# Patient Record
Sex: Female | Born: 1965 | Race: White | Hispanic: No | Marital: Married | Smoking: Never smoker
Health system: Southern US, Community
[De-identification: ages and names within clinical notes are randomized; demographics above are authoritative.]

## PROBLEM LIST (undated history)

## (undated) DIAGNOSIS — E785 Hyperlipidemia, unspecified: Secondary | ICD-10-CM

## (undated) DIAGNOSIS — R921 Mammographic calcification found on diagnostic imaging of breast: Secondary | ICD-10-CM

## (undated) DIAGNOSIS — D069 Carcinoma in situ of cervix, unspecified: Secondary | ICD-10-CM

## (undated) DIAGNOSIS — N6310 Unspecified lump in the right breast, unspecified quadrant: Secondary | ICD-10-CM

## (undated) HISTORY — DX: Hyperlipidemia, unspecified: E78.5

## (undated) HISTORY — DX: Unspecified lump in the right breast, unspecified quadrant: N63.10

## (undated) HISTORY — DX: Mammographic calcification found on diagnostic imaging of breast: R92.1

## (undated) HISTORY — DX: Carcinoma in situ of cervix, unspecified: D06.9

---
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
## 1999-03-01 ENCOUNTER — Other Ambulatory Visit: Admission: RE | Admit: 1999-03-01 | Discharge: 1999-03-01 | Payer: Self-pay | Admitting: Obstetrics and Gynecology

## 1999-03-18 ENCOUNTER — Other Ambulatory Visit: Admission: RE | Admit: 1999-03-18 | Discharge: 1999-03-18 | Payer: Self-pay | Admitting: Obstetrics and Gynecology

## 1999-11-21 ENCOUNTER — Other Ambulatory Visit: Admission: RE | Admit: 1999-11-21 | Discharge: 1999-11-21 | Payer: Self-pay | Admitting: Obstetrics and Gynecology

## 2001-11-17 DIAGNOSIS — C539 Malignant neoplasm of cervix uteri, unspecified: Secondary | ICD-10-CM

## 2001-11-17 HISTORY — DX: Malignant neoplasm of cervix uteri, unspecified: C53.9

## 2001-12-29 ENCOUNTER — Other Ambulatory Visit: Admission: RE | Admit: 2001-12-29 | Discharge: 2001-12-29 | Payer: Self-pay | Admitting: Obstetrics and Gynecology

## 2002-04-08 ENCOUNTER — Ambulatory Visit (HOSPITAL_COMMUNITY): Admission: RE | Admit: 2002-04-08 | Discharge: 2002-04-08 | Payer: Self-pay | Admitting: Obstetrics and Gynecology

## 2002-04-08 ENCOUNTER — Encounter (INDEPENDENT_AMBULATORY_CARE_PROVIDER_SITE_OTHER): Payer: Self-pay

## 2002-05-11 ENCOUNTER — Ambulatory Visit: Admission: RE | Admit: 2002-05-11 | Discharge: 2002-05-11 | Payer: Self-pay | Admitting: Gynecology

## 2002-05-17 HISTORY — PX: ABDOMINAL HYSTERECTOMY: SHX81

## 2002-05-19 ENCOUNTER — Encounter: Payer: Self-pay | Admitting: Gynecology

## 2002-05-24 ENCOUNTER — Encounter (INDEPENDENT_AMBULATORY_CARE_PROVIDER_SITE_OTHER): Payer: Self-pay | Admitting: Specialist

## 2002-05-24 ENCOUNTER — Inpatient Hospital Stay (HOSPITAL_COMMUNITY): Admission: RE | Admit: 2002-05-24 | Discharge: 2002-05-26 | Payer: Self-pay | Admitting: Obstetrics and Gynecology

## 2002-07-12 ENCOUNTER — Ambulatory Visit: Admission: RE | Admit: 2002-07-12 | Discharge: 2002-07-12 | Payer: Self-pay | Admitting: Gynecology

## 2002-09-14 ENCOUNTER — Other Ambulatory Visit: Admission: RE | Admit: 2002-09-14 | Discharge: 2002-09-14 | Payer: Self-pay | Admitting: Obstetrics and Gynecology

## 2002-12-21 ENCOUNTER — Ambulatory Visit: Admission: RE | Admit: 2002-12-21 | Discharge: 2002-12-21 | Payer: Self-pay | Admitting: Gynecologic Oncology

## 2003-03-21 ENCOUNTER — Other Ambulatory Visit: Admission: RE | Admit: 2003-03-21 | Discharge: 2003-03-21 | Payer: Self-pay | Admitting: Gynecologic Oncology

## 2003-03-21 ENCOUNTER — Ambulatory Visit: Admission: RE | Admit: 2003-03-21 | Discharge: 2003-03-21 | Payer: Self-pay | Admitting: Gynecologic Oncology

## 2003-03-21 ENCOUNTER — Encounter (INDEPENDENT_AMBULATORY_CARE_PROVIDER_SITE_OTHER): Payer: Self-pay | Admitting: *Deleted

## 2003-06-06 ENCOUNTER — Ambulatory Visit: Admission: RE | Admit: 2003-06-06 | Discharge: 2003-06-06 | Payer: Self-pay | Admitting: Gynecologic Oncology

## 2003-06-06 ENCOUNTER — Encounter (INDEPENDENT_AMBULATORY_CARE_PROVIDER_SITE_OTHER): Payer: Self-pay | Admitting: *Deleted

## 2003-06-06 ENCOUNTER — Other Ambulatory Visit: Admission: RE | Admit: 2003-06-06 | Discharge: 2003-06-06 | Payer: Self-pay | Admitting: Gynecologic Oncology

## 2003-11-30 ENCOUNTER — Other Ambulatory Visit: Admission: RE | Admit: 2003-11-30 | Discharge: 2003-11-30 | Payer: Self-pay | Admitting: Obstetrics and Gynecology

## 2004-05-31 ENCOUNTER — Other Ambulatory Visit: Admission: RE | Admit: 2004-05-31 | Discharge: 2004-05-31 | Payer: Self-pay | Admitting: Obstetrics and Gynecology

## 2005-01-29 ENCOUNTER — Other Ambulatory Visit: Admission: RE | Admit: 2005-01-29 | Discharge: 2005-01-29 | Payer: Self-pay | Admitting: Obstetrics and Gynecology

## 2005-02-13 ENCOUNTER — Ambulatory Visit (HOSPITAL_COMMUNITY): Admission: RE | Admit: 2005-02-13 | Discharge: 2005-02-13 | Payer: Self-pay | Admitting: Obstetrics and Gynecology

## 2005-08-20 ENCOUNTER — Other Ambulatory Visit: Admission: RE | Admit: 2005-08-20 | Discharge: 2005-08-20 | Payer: Self-pay | Admitting: Obstetrics and Gynecology

## 2006-01-27 ENCOUNTER — Other Ambulatory Visit: Admission: RE | Admit: 2006-01-27 | Discharge: 2006-01-27 | Payer: Self-pay | Admitting: Obstetrics and Gynecology

## 2006-02-16 ENCOUNTER — Ambulatory Visit (HOSPITAL_COMMUNITY): Admission: RE | Admit: 2006-02-16 | Discharge: 2006-02-16 | Payer: Self-pay | Admitting: Obstetrics and Gynecology

## 2007-03-11 ENCOUNTER — Ambulatory Visit (HOSPITAL_COMMUNITY): Admission: RE | Admit: 2007-03-11 | Discharge: 2007-03-11 | Payer: Self-pay | Admitting: Obstetrics and Gynecology

## 2008-03-21 ENCOUNTER — Ambulatory Visit (HOSPITAL_COMMUNITY): Admission: RE | Admit: 2008-03-21 | Discharge: 2008-03-21 | Payer: Self-pay | Admitting: Obstetrics and Gynecology

## 2008-03-30 ENCOUNTER — Encounter: Admission: RE | Admit: 2008-03-30 | Discharge: 2008-03-30 | Payer: Self-pay | Admitting: Obstetrics and Gynecology

## 2009-04-05 ENCOUNTER — Ambulatory Visit (HOSPITAL_COMMUNITY): Admission: RE | Admit: 2009-04-05 | Discharge: 2009-04-05 | Payer: Self-pay | Admitting: Obstetrics and Gynecology

## 2011-04-04 NOTE — Consult Note (Signed)
[facility]  Patient:    Rebekah Holmes, Rebekah Holmes Visit Number: [id number] MRN: [id number]          Service Type: GON Location: GYN Attending Physician:  Jeannette Corpus Dictated by:   Rande Brunt. Clarke-Pearson, M.D. Proc. Date: 05/11/02 Admit Date:  05/11/2002 Discharge Date: 05/11/2002   CC:         Silverio Lay, M.D.  Telford Nab, R.N.   Consultation Report  REASON FOR CONSULTATION:  A 45 year old white single female referred for consultation by Silverio Lay, M.D., regarding management of a newly-diagnosed cervical carcinoma.  The patients past history dates back to March 2000, when she had a high-grade SIL lesion.  In May 2000, she underwent a LEEP procedure revealing CIN-III with negative margins.  She subsequently had normal Pap smears in August 2000, January, May, and December _____.  In January 2003 she had a low-grade SIL lesion, which on further workup revealed a biopsy of the exocervix showing CIN-III lesions.  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  The lesion is at minimum 4 x 9 mm.  The patient has had an uncomplicated postoperative course.  PAST MEDICAL HISTORY:  Medical illnesses:  None.  Obstetrical history: Gravida 1.  Surgical history:  The patient had a traumatic fracture of her liver as a child requiring laparotomy.  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  MEDICATIONS:  Minocin.  DRUG ALLERGIES:  None.  FAMILY HISTORY:  The patient has a maternal grandmother with pancreatic cancer and a maternal aunt with ovarian cancer.  SOCIAL HISTORY:  The patient is divorced.  She has a 70 year old child.  She works as a Public librarian.  REVIEW OF SYSTEMS:  Essentially negative except for some vaginal bleeding. She  denies any GI or GU symptoms, has no cardiovascular, pulmonary, or neurologic symptoms.  PHYSICAL EXAMINATION:  VITAL SIGNS:  Weight 111 pounds, blood pressure 160/100.  GENERAL:  The patient is a healthy young woman in no acute distress.  HEENT:  Negative.  NECK:  Supple without thyromegaly.  LYMPHATIC:  There is no supraclavicular or inguinal adenopathy.  ABDOMEN:  Soft, nontender.  No mass, organomegaly, ascites, or hernias are noted.  PELVIC:  EG, BUS normal.  The vagina has some blood in it.  No lesions are noted.  The cervix is flush with the upper vagina, and there is some bleeding coming from the cervix from what looks like the cone bed.  Bimanual and rectovaginal exam reveal a small cervix and uterus which is anterior.  There is no parametrial involvement and no adnexal masses.  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  I had a lengthy discussion with the patient and her mother regarding management options.  We talked in detail about the pros and cons of both radiation therapy and surgery.  After discussing this and emphasizing that both have similar cure rates, the patient elects to proceed with surgery.  The risks of surgery, including hemorrhage, infection, injury to adjacent viscera, and thromboembolic complications, were all outlined.  I especially emphasized the fact that a good number of patients will have bladder dysfunction regarding radical hysterectomy and that she will need to use a suprapubic catheter initially.  The possibility of intermittent self-catheterization was also discussed.  We would anticipate leaving her ovaries in place for further  hormone function.  All the patients questions are answered, and she agreed to proceed with surgery.  We will coordinate surgery with Dr. Estanislado Pandy and anticipate proceeding on May 24, 2002. Dictated by:   Rande Brunt. Clarke-Pearson, M.D. Attending Physician:  Jeannette Corpus DD:   05/11/02 TD:  05/13/02 Job: [id number]

## 2011-04-04 NOTE — Consult Note (Signed)
NAME:  Rebekah Holmes, Rebekah Holmes                       ACCOUNT NO.:  [id number]   MEDICAL RECORD NO.:  [id number]                   PATIENT TYPE:  OUT   LOCATION:  GYN                                  FACILITY:  [facility]   PHYSICIAN:  John T. Kyla Balzarine, M.D.                 DATE OF BIRTH:  05/19/1966   DATE OF CONSULTATION:  03/21/2003  DATE OF DISCHARGE:                                   CONSULTATION   GYNECOLOGY CONSULTATION:   CHIEF COMPLAINT:  Ongoing followup of vaginal dysplasia after radical  hysterectomy.   HISTORY OF PRESENT ILLNESS:  The patient had cone biopsy revealing stage IB-  1 squamous carcinoma of the cervix in July 2003.  Radical hysterectomy  specimen had no residual tumor, margins and nodes negative.  Pap smear in  October 2003 significant for possible low-grade SIL.  Dr. Estanislado Pandy performed  colposcopy with no specific changes suggesting dysplasia.  Biopsy returned  slight dysplasia.  I saw the patient 3 months ago and recommended weekly  vaginal Premarin which she has been using.   MEDICATIONS:  Minocin for acne rosacea.   ALLERGIES:  None known.   PAST MEDICAL HISTORY, PERSONAL AND SOCIAL HISTORY, FAMILY HISTORY AND REVIEW  OF SYSTEMS:  Reviewed and essentially unchanged from her intake evaluation  in June 2003, noncontributory.   PHYSICAL EXAMINATION:  VITAL SIGNS:  Weight 113 pounds, blood pressure  132/76, otherwise stable and afebrile.  GENERAL:  The patient is anxious, alert and oriented x3 in no acute  distress.  ENT:  Benign with clear oropharynx.  BACK:  No back or CVA tenderness.  ABDOMEN:  Soft and benign, well-healed transverse incision without hernia,  ascites, mass, or tenderness.  LYMPHATICS:  Negative.  EXTREMITIES:  No edema.  PELVIC:  External genitalia and BUS are normal to inspection and palpation.  The bladder and urethra are well supported and there are no gross vaginal  lesions.  Bimanual and rectovaginal examinations reveal minimal  postoperative induration but no submucosal mass, adnexal or parametrial mass  or nodularity.   PROCEDURE NOTE:  After verbal informed consent was obtained, I performed  colposcopy of the entire vaginal canal.  No acetowhite epithelium is seen.  There remained slightly atrophic vaginal mucosa over the apical scar with an  atrophic vascular pad, but again no acetowhite change.  No biopsy is  obtained.   ASSESSMENT:  Low-grade vaginal dysplasia following radical hysterectomy for  stage IB-1 squamous carcinoma of the cervix.    PLAN:  The patient is reassured regarding current findings and will return  for followup in 3-4 months.  The Pap smear results from today will be  communicated to the patient.  John T. Kyla Balzarine, M.D.    JTS/MEDQ  D:  03/21/2003  T:  03/21/2003  Job:  [id number]   cc:   Dois Davenport A. Rivard, M.D.  [address]  [place]  [place] [address]  Fax: [phone]   Telford Nab, R.N.

## 2011-04-04 NOTE — Consult Note (Signed)
NAME:  Rebekah Holmes, Rebekah Holmes                       ACCOUNT NO.:  [id number]   MEDICAL RECORD NO.:  [id number]                   PATIENT TYPE:  OUT   LOCATION:  GYN                                  FACILITY:  [facility]   PHYSICIAN:  John T. Kyla Balzarine, M.D.                 DATE OF BIRTH:  01-21-1966   DATE OF CONSULTATION:  12/21/2002  DATE OF DISCHARGE:                                   CONSULTATION   CHIEF COMPLAINT:  This 45 year old woman presents for evaluation of vaginal  dysplasia following radical hysterectomy for cervical cancer.   HISTORY OF PRESENT ILLNESS:  The patient had a cone biopsy revealing stage  1B-1 squamous carcinoma of the cervix on May 24, 2002. She had no residual  tumor in the uterus, margins and nodes were negative. The patient was  followed with initial normal Pap smear but Pap smear in October 2003 was  significant for possible low grade SIL. She was seen by Dr. Dois Davenport Rivard  who performed colposcopy, revealing only some vascularity overlying the  surgical incision, thought to represent fibrosis. Biopsy was obtained and  returned slight dysplasia.  The patient  is extremely concerned that this  might represent recurrent cervical cancer. She denies pelvic pain or vaginal  bleeding. She denies change in bowel or bladder function other than urinary  hesitance since her surgery. She denies back pain or leg swelling.   CURRENT MEDICATIONS:  Minocin for acne rosacea.   ALLERGIES:  None known.   Past medical history, personal social history, family history are reviewed  and unchanged from her intake evaluation in June 2003.   REVIEW OF SYMPTOMS:  Otherwise negative.   PHYSICAL EXAMINATION:  VITAL SIGNS:  Weight 114 pounds, blood pressure  120/76, otherwise stable and afebrile.  GENERAL:  The patient is anxious, alert, and oriented x3 in no acute  distress.  ENT:  Benign with clear oropharynx.  BACK:  There is no back or CVA tenderness.  ABDOMEN:  Soft and benign  with a well healed transverse incision, without  hernia, ascites, mass or tenderness.  EXTREMITIES:  Have no edema and there is no pathologic lymphadenopathy.  PELVIC:  External genitalia and BUS are normal to inspection and palpation.  The bladder and urethra are well supported and there are not gross vaginal  mucosal lesions. Bimanual and rectovaginal examinations reveal minimal  postoperative induration in the left fornix but no submucosal mass, adnexal  or parametrial mass or nodularity. Rectal is confirmatory.   PROCEDURE NOTE:  After verbal informed consent was obtained, I performed  colposcopy of the entire vaginal canal. No acetowhite epithelium is seen.  There is slightly atrophic vaginal mucosa over the apical scar, with  atrophic vascular pattern, but again no acetowhite change. Healing biopsy  site is visualized. No biopsy is obtained.   ASSESSMENT:  Low grade vaginal dysplasia following a radical hysterectomy  for stage 1B-1 squamous carcinoma  of the cervix. Currently no evidence of  recurrent cervical cancer and minimal findings on colposcopy.   FINDINGS AND RECOMMENDATIONS:  I had a discussion with the patient regarding  the possibility that her cytology and biopsy might reflect atrophy overlying  the surgical vaginal scar rather than a true dysplasia. In any event,  treatment with vaginal estrogen would help to accentuate abnormalities of  the vaginal cuff. I recommended that the patient use vaginal Premarin 1 gm  weekly and that we reevaluate her in 3-4 months with followup cytology and  colposcopy. If her colposcopy is normal and subsequent Pap smear normalizes,  we can continue routine, round robin followup.                                               John T. Kyla Balzarine, M.D.    JTS/MEDQ  D:  12/21/2002  T:  12/21/2002  Job:  [id number]   cc:   Telford Nab, R.N.  [address] [place], [place] [address]  Fax: 1   Crist Fat. Rivard, M.D.  [address]   [place]  [place] [address]  Fax: [phone]

## 2011-04-04 NOTE — Op Note (Signed)
[facility]  Patient:    Rebekah Holmes, Rebekah Holmes Visit Number: [id number] MRN: [id number]          Service Type: GYN Location: [phone] Attending Physician:  Esmeralda Arthur Dictated by:   Rande Brunt. Clarke-Pearson, M.D. Admit Date:  05/24/2002 Discharge Date: 05/26/2002                             Operative Report  INCOMPLETE  PREOPERATIVE DIAGNOSIS:  Stage I-B1 squamous cell carcinoma of the cervix.  POSTOPERATIVE DIAGNOSIS:  Stage I-B1 squamous cell carcinoma of the cervix.  PROCEDURES:  Radical abdominal hysterectomy, pelvic lymphadenectomy, placement of suprapubic catheter, transposition of the ovaries, removal of left paraovarian cyst.  SURGEON:  Daniel L. Clarke-Pearson, M.D.  ASSISTANTS:  Silverio Lay, M.D., and Telford Nab, R.N.  ANESTHESIA:  General with orotracheal tube.  ESTIMATED BLOOD LOSS:  150 cc.  SURGICAL FINDINGS:  The cervix was status post a LEEP procedure and was flush with the vaginal vault.  At the time of exploratory laparotomy the upper abdomen had multiple adhesions secondary to prior liver surgery.  The omentum was adherent to the anterior abdominal wall near the umbilicus.  In the pelvis the uterus had a 1 cm fibroid arising from the fundus.  There was a 4 cm left paraovarian cyst.  The ovaries otherwise appeared normal, as did the rest of the pelvic structures.  The pelvic and periaortic lymph nodes appeared normal.  DESCRIPTION OF PROCEDURE:  The patient was brought to the operating room and after satisfactory attainment of general endotracheal anesthesia was placed in a modified lithotomy position in [place] stirrups.  The anterior abdominal wall, perineum, and vagina were prepped with Betadine, the patient was draped, and a Foley catheter was placed.  The abdomen was entered through a Pfannenstiel incision.  The abdomen and pelvis were explored with the above-noted findings, and the adhesions in the lower  abdomen were lysed with cautery.  A Bookwalter retractor was positioned and the bowel was packed out of the pelvis.  The uterus was grasped with large Kelly clamps.  The round ligaments were divided and the pararectal and paravesical spaces were opened.  The pelvic vessels and ureter were identified.  The uterine-ovarian anastomosis and fallopian tube was isolated, crossclamped, divided, free-tied, and suture ligated, thus preserving the ovaries bilaterally.  On the left side a paraovarian cyst was excised along with a portion of the tube.  The superior vesical artery was traced cephalad until the uterine artery was identified.  The uterine artery was double-clipped at its origin and divided and the proximal portion identified by tying a silk suture around it.  The cardinal ligament was then divided at the pelvic sidewall with Endo-GIA staplers.  The ureters were mobilized away from the peritoneum of the pelvic sidewall, and the rectovaginal septum was developed.  The uterosacral ligaments were further developed and then divided using the Endo-GIA stapler.  The bladder flap was advanced with sharp and blunt dissection beyond the cervix and exposing the upper vagina.  The ureters were then removed from the paraureteric tunnel using sharp dissection, dividing first the anterior vesicouterine ligament. These pedicles were suture ligated using 2-0 Vicryl.  Further dissection was carried out until the ureter inserted into the bladder.  The ureter was then mobilized laterally and the posterior vesicouterine ligament was divided. Paracervical tissues were crossclamped, divided, and suture ligated.  The vaginal angles were crossclamped, the vagina transected.  The uterus, cervix, and upper vagina were handed off the operative field as a single specimen. The vaginal angles were transfixed using 0 Vicryl, the central portion of the vagina closed with a running locking suture of 0 Vicryl.  The  bladder peritoneum, vaginal cuff, and posterior rectal peritoneum were then reapproximated with interrupted 0 Vicryl sutures.  At this juncture hemostasis was excellent.  Attention was turned to performing the pelvic lymphadenectomy.  All the visible lymph nodes from the external iliac artery and vein, internal iliac artery, and obturator fossa were resected.  Care was taken to avoid injury to the obturator nerve and vessels.  The genitofemoral nerve was difficult to identify, especially on the right side.  Hot packs were placed in the pelvis. The retropubic space was opened and the dome of the bladder incised.  A suprapubic Silastic catheter was inserted in the midline just above the pubis, the bulb inflated in the bladder, and then the bladder closed in two layers, the first being a pursestring suture incorporating all bladders including the muscularis.  A second imbricating suture was placed.  The catheter was sutured to the skin using 2-0 silk.  The ovaries were marked with two large hemoclips and then mobilized and sutured to the paracolic gutters laterally in an attempt to avoid Dictated by:   Reuel Boom L. Clarke-Pearson, M.D. Attending Physician:  Esmeralda Arthur DD:  05/24/02 TD:  05/26/02 Job: [id number]

## 2011-04-04 NOTE — Op Note (Signed)
[facility]  Patient:    Rebekah Holmes, Rebekah Holmes Visit Number: [id number] MRN: [id number]          Service Type: DSU Location: [facility] Attending Physician:  Esmeralda Arthur Dictated by:   Silverio Lay, M.D. Proc. Date: 04/08/02 Admit Date:  04/08/2002 Discharge Date: 04/08/2002                             Operative Report  DATE OF BIRTH:                23-Feb-1966  PREOPERATIVE DIAGNOSIS:       Recurrent cervical intraepithelial neoplasia, grade 3, or severe dysplasia.  POSTOPERATIVE DIAGNOSIS:     Recurrent cervical intraepithelial neoplasia, grade 3, or severe dysplasia.  Rule out ______ /   OPERATION:                    Loop electrosurgical excision procedure.  SURGEON:                      Silverio Lay, M.D.  ANESTHESIA:                   Paracervical block with lidocaine 1% with epinephrine.  ESTIMATED BLOOD LOSS:         Minimal.  DESCRIPTION OF PROCEDURE:     After being informed of the planned procedure and the possible complications including failure of treatment as well as cervical incompetence, bleeding, infection, informed consent was obtained. The patient was taken to OR #3, placed in the lithotomy position.  We prepped and draped in a sterile fashion using chlorhexidine clear.  Speculum was inserted.  Colposcope was moved in placed, and acetic acid was swabbed on the cervix.  Colposcopy revealed a large anterior lip lesion with atypical vessels previously biopsied as CIN-3, as well as a large acetowhite with mosaicism posterior lip lesion which was previously biopsied as CIN-1.  The T zone revealed atypical vessels.  We proceeded with a paracervical block using lidocaine 1% with epinephrine 8 cc in the usual manner.  We then proceeded with LEEP in three passes, one on the anterior lip, two in the posterior lip, which completely removed the visible lesions.  The bed of excision was then cauterized using cauterization and  [place] applied.  The specimen was identified as anterior lip at 3, 9, and 12 oclock and posterior lip with endocervical margin 3, 9, and 6 oclock with two areas of concern, mainly at 12 and 6 oclock.  The patient was made aware of the operative findings.  We will await pathology report.  She was also made aware that, due to this being a second LEEP procedure, it would be extremely difficult to perform a third intervention on the cervix since there is so little cervix remaining.  Will follow up in two weeks and review pathology report with patient.  The patient was informed to call if she experiences increased bleeding, increased pain, or fever. Dictated by:   Silverio Lay, M.D. Attending Physician:  Esmeralda Arthur DD:  04/08/02 TD:  04/11/02 Job: [id number]

## 2011-04-04 NOTE — Consult Note (Signed)
   NAME:  Rebekah Holmes, Rebekah Holmes                       ACCOUNT NO.:  [id number]   MEDICAL RECORD NO.:  [id number]                   PATIENT TYPE:  OUT   LOCATION:  GYN                                  FACILITY:  [facility]   PHYSICIAN:  Daniel L. Clarke-Pearson, M.D.      DATE OF BIRTH:  10-08-1966   DATE OF CONSULTATION:  07/12/2002  DATE OF DISCHARGE:  07/12/2002                                   CONSULTATION   HISTORY OF PRESENT ILLNESS:  A 45 year old returns, having undergone a  radical abdominal hysterectomy and pelvic lymphadenectomy on May 24, 2002.  She has had an uncomplicated postoperative course.  Her pathology was very  favorable with no residual catheter in the cervix and all lymph nodes were  free of any evidence of metastatic disease (20 lymph nodes).  The patient  reports she has returned to nearly full levels of activity. She has normal  GI function. She has some urinary hesitance but she does feel like she  empties her bladder and is not having any evidence of retention or  incontinence.   Her functional status is excellent.   REVIEW OF SYSTEMS:  Negative.   FAMILY HISTORY:  Reviewed and unchanged from previous notations.   SOCIAL HISTORY:  Reviewed and unchanged from previous notations.   PHYSICAL EXAMINATION:  VITAL SIGNS:  Weight 118 pounds.  ABDOMEN:  Soft and nontender, no masses, organomegaly, ascites or hernias  are noted.  Suprapubic site is healing nicely as well.  PELVIC:  EGBUS, vagina, bladder and urethra are normal. The vaginal cuff was  healing nicely.  Bimanual and rectovaginal exam reveals some postoperative  induration without any masses or nodularity.  EXTREMITIES:  Lower extremities are without edema or varicosities.    IMPRESSION:  Stage IBI squamous cell carcinoma of the cervix with very  favorable final pathology.  We do not plan any additional therapy.  Will  have the patient return to see [place] in three months for continuing  surveillance.  The  patient is given the okay to return to full levels of  activity.                                                Daniel L. Stanford Breed, M.D.    DLC/MEDQ  D:  07/26/2002  T:  07/26/2002  Job:  [id number]   cc:   Dois Davenport A. Rivard, M.D.  [address]  [place]  [place] [address]  Fax: [phone]   Telford Nab, R.N.

## 2011-04-04 NOTE — Consult Note (Signed)
NAME:  Rebekah Holmes, Rebekah Holmes                       ACCOUNT NO.:  [id number]   MEDICAL RECORD NO.:  [id number]                   PATIENT TYPE:  OUT   LOCATION:  GYN                                  FACILITY:  [facility]   PHYSICIAN:  John T. Kyla Balzarine, M.D.                 DATE OF BIRTH:  Jan 23, 1966   DATE OF CONSULTATION:  06/06/2003  DATE OF DISCHARGE:                                   CONSULTATION   CHIEF COMPLAINT:  Ongoing followup of vaginal dysplasia after radical  hysterectomy.   HISTORY OF PRESENT ILLNESS:  The patient had stage IB1 squamous carcinoma of  the cervix diagnosed by cervical conization in July 2003.  A radical  hysterectomy specimen had no residual tumor.  The margins and nodes were  negative.  Pap smear in October 2003 revealed low-grade SIL.  Colposcopy had  no specific changes suggesting dysplasia, but a biopsy revealed slight  dysplasia.  The patient was treated with weekly vaginal Premarin and yet  another Pap smear returned positive for low-grade SIL in May 2004.  The  patient returns for a three-month followup Pap smear.  She has married and  changed her name since she was last seen.   PAST MEDICAL HISTORY, PERSONAL AND SOCIAL HISTORY, FAMILY HISTORY, AND  REVIEW OF SYSTEMS:  Reviewed and essentially unchanged from her in-date  evaluation of June 2003, and noncontributory otherwise.   MEDICATIONS:  Minocin for acne rosacea.   ALLERGIES:  None known.   PHYSICAL EXAMINATION:  VITAL SIGNS:  Weight 116 pounds, blood pressure  108/76.  Vital signs otherwise stable and afebrile.  GENERAL:  The patient is anxious, alert and oriented x3, in no acute  distress.  BACK:  No CVA or back tenderness.  ABDOMEN:  Soft and benign with a well-healed transverse incision; no hernia,  ascites, mass, or tenderness.  LYMPH:  No pathologic lymphadenopathy.  EXTREMITIES:  Full range of motion and strength without edema.  PELVIC:  External genitalia and BUS are normal to inspection  and palpation.  The bladder and urethra are well supported, and there are no gross vaginal  lesions.  Bimanual and rectovaginal examinations reveal minimal  postoperative induration, which has essentially resolved.  There is no  submucosal mass, adnexal, or parametrial mass or nodularity.   ASSESSMENT:  Low-grade vaginal dysplasia following radical hysterectomy for  stage IB-1 squamous carcinoma of the cervix.    PLAN:  Pap smear is repeated.  If this has regressed to normal or has  persistent low-grade changes, I would continue to follow her with cytology  at six-month intervals.  Should she have progression, I would recommend  upper colpectomy.  The patient understands the rationale for these  recommendations.  If we are following her cytology at six-month intervals, I  would have her next cytology be performed by Dr. Dorette Grate.  John T. Kyla Balzarine, M.D.    JTS/MEDQ  D:  06/06/2003  T:  06/06/2003  Job:  [id number]   cc:   Dorette Grate, M.D.  [address]  [place], [place] [address]   Telford Nab, R.N.  [address]  [place], [place] [address]

## 2012-12-02 ENCOUNTER — Ambulatory Visit: Payer: Self-pay | Admitting: Obstetrics and Gynecology

## 2013-02-02 ENCOUNTER — Other Ambulatory Visit: Payer: Self-pay | Admitting: Obstetrics and Gynecology

## 2013-02-03 LAB — PAP, THINPREP RFLX HPV

## 2021-06-26 ENCOUNTER — Other Ambulatory Visit: Payer: Self-pay | Admitting: Obstetrics and Gynecology

## 2021-06-26 DIAGNOSIS — N6489 Other specified disorders of breast: Secondary | ICD-10-CM

## 2021-06-26 DIAGNOSIS — N6310 Unspecified lump in the right breast, unspecified quadrant: Secondary | ICD-10-CM

## 2021-07-08 ENCOUNTER — Other Ambulatory Visit: Payer: Self-pay

## 2021-09-18 ENCOUNTER — Ambulatory Visit: Payer: Self-pay | Admitting: Cardiology

## 2021-10-23 ENCOUNTER — Ambulatory Visit: Payer: Self-pay | Admitting: Cardiology

## 2021-12-05 ENCOUNTER — Other Ambulatory Visit: Payer: Self-pay

## 2021-12-05 ENCOUNTER — Ambulatory Visit (INDEPENDENT_AMBULATORY_CARE_PROVIDER_SITE_OTHER): Payer: BC Managed Care – PPO | Admitting: Cardiology

## 2021-12-05 ENCOUNTER — Encounter: Payer: Self-pay | Admitting: Cardiology

## 2021-12-05 VITALS — BP 106/70 | HR 67 | Ht 63.0 in | Wt 128.0 lb

## 2021-12-05 DIAGNOSIS — Z8249 Family history of ischemic heart disease and other diseases of the circulatory system: Secondary | ICD-10-CM | POA: Diagnosis not present

## 2021-12-05 DIAGNOSIS — E7800 Pure hypercholesterolemia, unspecified: Secondary | ICD-10-CM

## 2021-12-05 MED ORDER — ROSUVASTATIN CALCIUM 10 MG PO TABS: 10.0000 mg | ORAL_TABLET | Freq: Every day | ORAL | 3 refills | Status: DC

## 2021-12-05 NOTE — Addendum Note (Signed)
Addended by: Theresia Majors on: 12/05/2021 09:25 AM   Modules accepted: Orders

## 2021-12-05 NOTE — Patient Instructions (Signed)
Medication Instructions:  Your physician has recommended you make the following change in your medication: 1) START taking Crestor (rosuvastatin) 10 mg daily  *If you need a refill on your cardiac medications before your next appointment, please call your pharmacy*   Lab Work: IN 6 WEEKS: Fasting lipids and ALT If you have labs (blood work) drawn today and your tests are completely normal, you will receive your results only by: MyChart Message (if you have MyChart) OR A paper copy in the mail If you have any lab test that is abnormal or we need to change your treatment, we will call you to review the results.   Testing/Procedures: Your physician has requested that you have a calcium score CT scan. There is a $99 fee for the scan.   Follow-Up: At [facility], you and your health needs are our priority.  As part of our continuing mission to provide you with exceptional heart care, we have created designated Provider Care Teams.  These Care Teams include your primary Cardiologist (physician) and Advanced Practice Providers (APPs -  Physician Assistants and Nurse Practitioners) who all work together to provide you with the care you need, when you need it.  Your next appointment:   1 year(s)  The format for your next appointment:   In Person  Provider:   Armanda Magic, MD

## 2021-12-05 NOTE — Progress Notes (Addendum)
Cardiology CONSULT Note    Date:  12/05/2021   ID:  Rebekah Holmes, DOB 30-Apr-1966, MRN [id number]  PCP:  Delsa Bern, MD  Cardiologist:  Fransico Him, MD   Chief Complaint  Patient presents with   New Patient (Initial Visit)    Hyperlipidemia    History of Present Illness:  Rebekah Holmes is a 56 y.o. female who is being seen today for the evaluation of hyperlipidemia at the request of Rivard, Katharine Look, MD.  This is a 56 year old female with a history of cervical cancer, hyperlipidemia who is referred by Dr. Cletis Media for evaluation of hyperlipidemia and cardiac risk assessment.  Her most recent lipids on 06/19/2021 showed a total cholesterol of 228, triglycerides 232, HDL 44 LDL 146.  Her TSH was normal.  She has a fm hx of CAD in her paternal grandfather in his 87's she thinks and her father is 31 and  had CAD dx in his 53's  She is here today for followup and is doing well.  She denies any chest pain or pressure, SOB, DOE, PND, orthopnea, LE edema, dizziness, palpitations or syncope. She is compliant with her meds and is tolerating meds with no SE.   She exercises daily 15 min of stretching/yoga and then 15 min of walking each afternoon.   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   Multiple Vitamin (MULTIVITAMIN ADULT) TABS Take 1 tablet by mouth daily.    Allergies:   Patient has no known allergies.   Social History   Socioeconomic History   Marital status: Married    Spouse name: Not on file   Number of children: Not on file   Years of education: Not on file   Highest education level: Not on file  Occupational History   Not on file  Tobacco Use   Smoking status: Never    Passive exposure:  Never   Smokeless tobacco: Not on file  Vaping Use   Vaping Use: Never used  Substance and Sexual Activity   Alcohol use: Not on file   Drug use: Not on file   Sexual activity: Yes  Other Topics Concern   Not on file  Social History Narrative   Not on file   Social Determinants of Health   Financial Resource Strain: Not on file  Food Insecurity: Not on file  Transportation Needs: Not on file  Physical Activity: Not on file  Stress: Not on file  Social Connections: Not on file     Family History:  The patient's family history includes CVA in her father; Heart attack in her maternal grandfather; Heart disease in her paternal grandfather; Pancreatic disease in her maternal grandmother.   ROS:   Please see the history of present illness.    ROS All other systems reviewed and are negative.  No flowsheet data found.   PHYSICAL EXAM:   VS:  BP 106/70    Pulse 67    Ht 5\' 3"  (1.6 m)    Wt 128 lb (58.1 kg)    SpO2 99%    BMI 22.67 kg/m    GEN: Well nourished, well developed, in no acute distress  HEENT: normal  Neck: no JVD, carotid bruits, or masses Cardiac: RRR; no murmurs, rubs, or gallops,no edema.  Intact distal pulses bilaterally.  Respiratory:  clear to auscultation bilaterally, normal work of breathing GI: soft, nontender, nondistended, + BS MS: no deformity or atrophy  Skin: warm and dry, no rash Neuro:  Alert and Oriented x 3, Strength and sensation are intact Psych: euthymic mood, full affect  Wt Readings from Last 3 Encounters:  12/05/21 128 lb (58.1 kg)      Studies/Labs Reviewed:   EKG:  EKG is ordered today.  The ekg ordered today demonstrates NSR with nonspecific T wave abnormality  Recent Labs: No results found for requested labs within last 8760 hours.   Lipid Panel No results found for: CHOL, TRIG, HDL, CHOLHDL, VLDL, LDLCALC, LDLDIRECT   Additional studies/ records that were reviewed today include:  Office visit notes and labs from  PCP/GYN    ASSESSMENT:    1. Pure hypercholesterolemia   2. Family history of premature CAD      PLAN:  In order of problems listed above:  Hyperlipidemia -Her LDL goal is less than 100 and it was 146 on labs back in the summer as well is increased triglycerides. -I have recommended starting her on Crestor 10 mg daily with repeat FLP and ALT in 6 weeks -We discussed low-fat diet and I will give her some literature today to go home with -Encouraged daily exercise for at least 30 to 45 minutes 5 days a week  2.  Assessment for cardiac risk factors/Family hx of premature CAD -She does have hyperlipidemia as well as a family history of CAD with her father and his father with CAD -She has no anginal symptoms -I recommend a coronary calcium score to assess future risk -We discussed if her calcium score is very high that I would proceed with a stress echo to rule out ischemia. -Shared Decision Making/Informed Consent The risks [chest pain, shortness of breath, cardiac arrhythmias, dizziness, blood pressure fluctuations, myocardial infarction, stroke/transient ischemic attack, and life-threatening complications (estimated to be 1 in 10,000)], benefits (risk stratification, diagnosing coronary artery disease, treatment guidance) and alternatives of a stress or dobutamine stress echocardiogram were discussed in detail with Rebekah Holmes and she agrees to proceed if needed pending results of coronary Ca score  Time Spent: 20 minutes total time of encounter, including 15 minutes spent in face-to-face patient care on the date of this encounter. This time includes coordination of care and counseling regarding above mentioned problem list. Remainder of non-face-to-face time involved reviewing chart documents/testing relevant to the patient encounter and documentation in the medical record. I have independently reviewed documentation from referring provider  Medication Adjustments/Labs and Tests  Ordered: Current medicines are reviewed at length with the patient today.  Concerns regarding medicines are outlined above.  Medication changes, Labs and Tests ordered today are listed in the Patient Instructions below.  There are no Patient Instructions on file for this visit.   Signed, Fransico Him, MD  12/05/2021 9:05 AM    [facility] [address], [place], [place]  [address] Phone: [phone]; Fax: [phone]

## 2022-01-09 ENCOUNTER — Other Ambulatory Visit: Payer: Self-pay

## 2022-01-09 ENCOUNTER — Ambulatory Visit (INDEPENDENT_AMBULATORY_CARE_PROVIDER_SITE_OTHER)
Admission: RE | Admit: 2022-01-09 | Discharge: 2022-01-09 | Disposition: A | Discharge status: Home / Self Care | Payer: Self-pay | Source: Ambulatory Visit | Attending: Cardiology | Admitting: Cardiology

## 2022-01-09 DIAGNOSIS — E7800 Pure hypercholesterolemia, unspecified: Secondary | ICD-10-CM

## 2022-01-09 DIAGNOSIS — Z8249 Family history of ischemic heart disease and other diseases of the circulatory system: Secondary | ICD-10-CM

## 2022-03-14 IMAGING — CT CT CARDIAC CORONARY ARTERY CALCIUM SCORE
3 series · 14 of 20 positions shown, 16 images · non-contrast
Comparison: None.
COMPARISON: None.

Addendum:
EXAM:
OVER-READ INTERPRETATION  CT CHEST

The following report is an over-read performed by radiologist Dr.
Mahesh Babu Ammulu [[email or web address]] on 01/09/2022. This
over-read does not include interpretation of cardiac or coronary
anatomy or pathology. The coronary calcium score interpretation by
the cardiologist is attached.
CLINICAL DATA: 55F for cardiovascular disease risk stratification
Coronary Calcium Score
TECHNIQUE: A gated, non-contrast computed tomography scan of the heart was
performed using 3mm slice thickness. Axial images were analyzed on a
dedicated workstation. Calcium scoring of the coronary arteries was
performed using the Agatston method.

[Series 2: cascseq 2.0 sa36 70% (id) · axial · 0.39mm/px · z∈[-106,-16]mm · 4 of 76 slices shown]
[im 16/76  vessel]
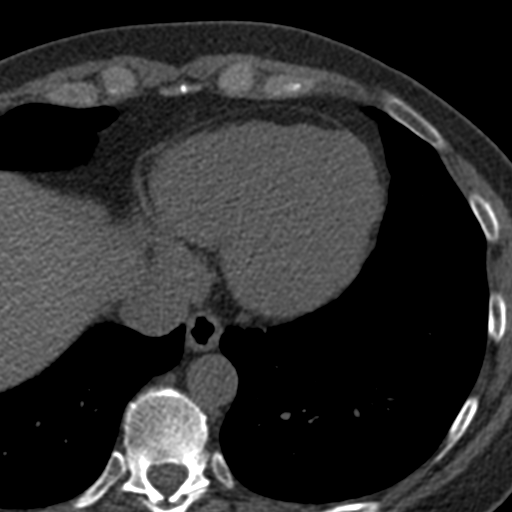
[im 31/76  vessel]
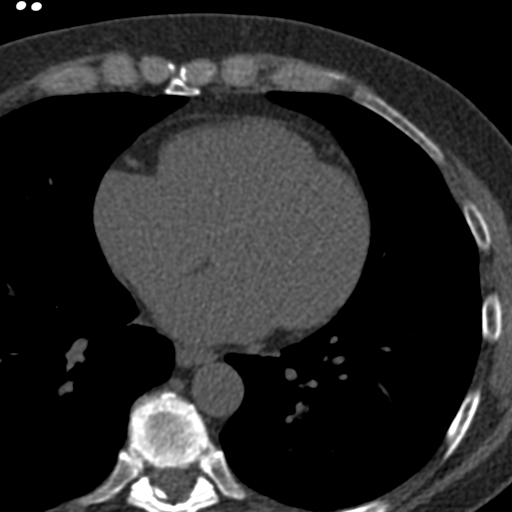
[im 46/76  vessel]
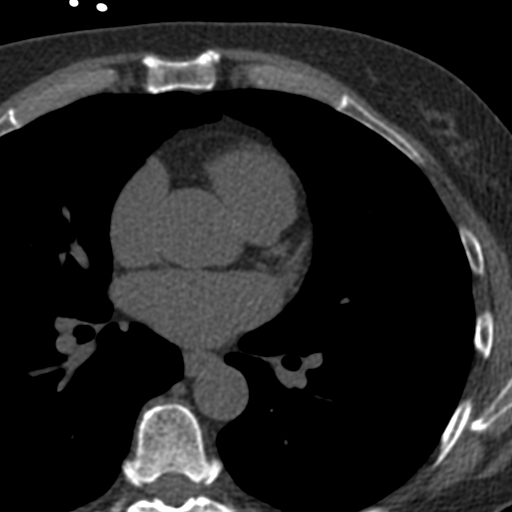
[im 61/76  vessel]
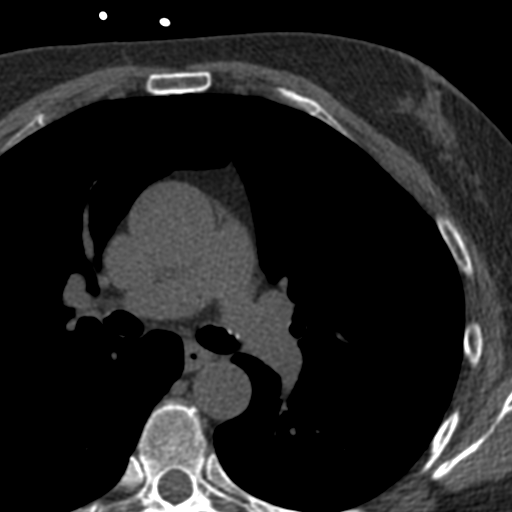

[Series 3: cascseq 2.0 bf37 st · axial · 0.65mm/px · z∈[-112,-12]mm · 5 of 76 slices shown, 7 images]
[im 13/76  vessel]
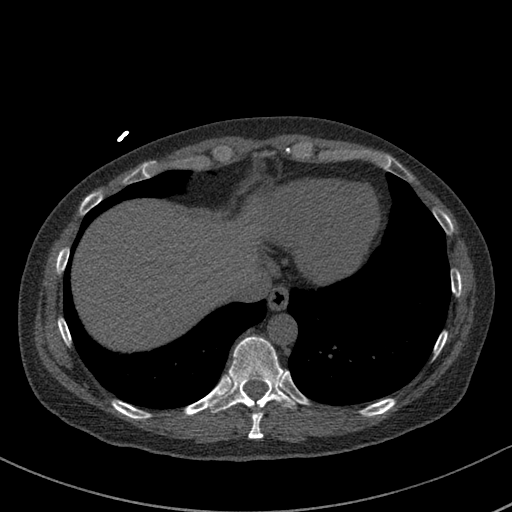
[im 13/76  lung]
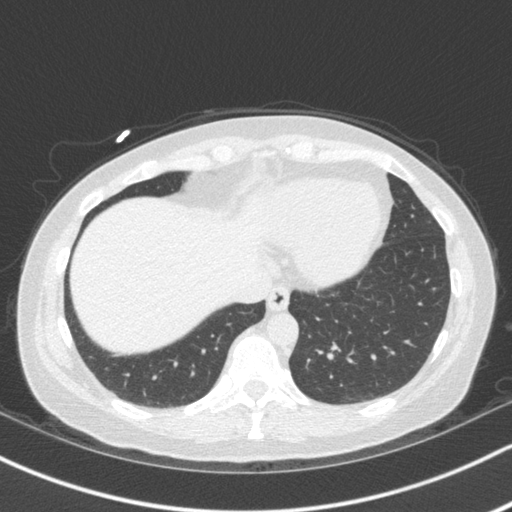
[im 26/76  vessel]
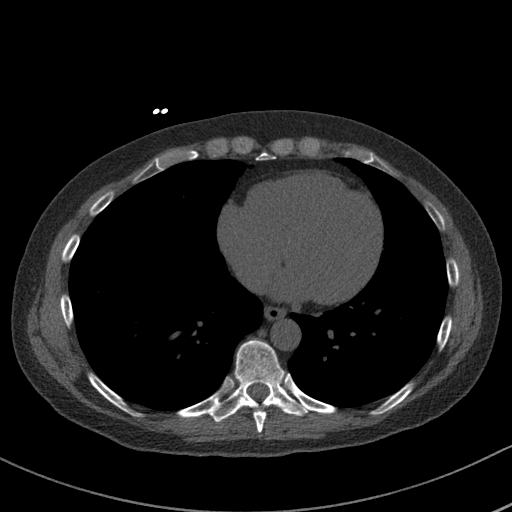
[im 38/76  vessel]
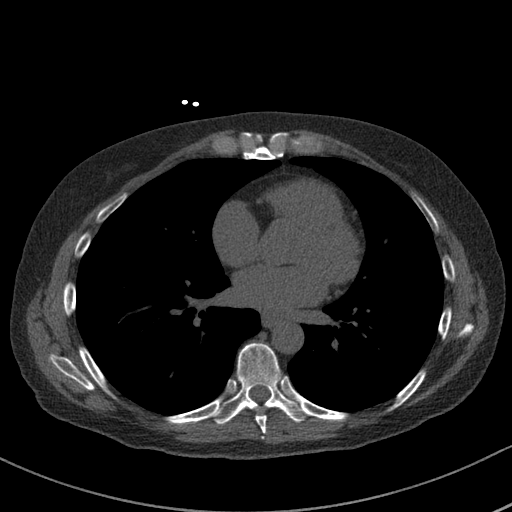
[im 51/76  vessel]
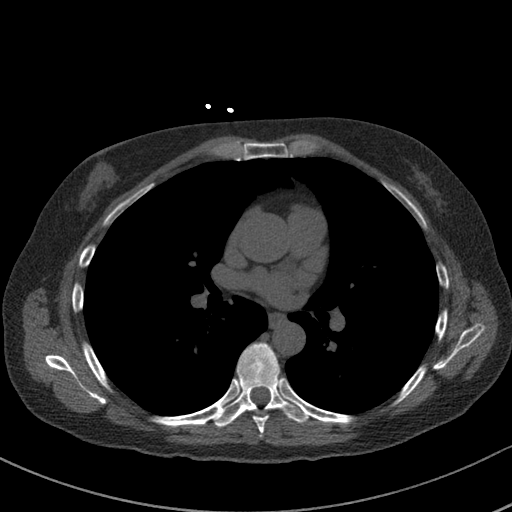
[im 63/76  vessel]
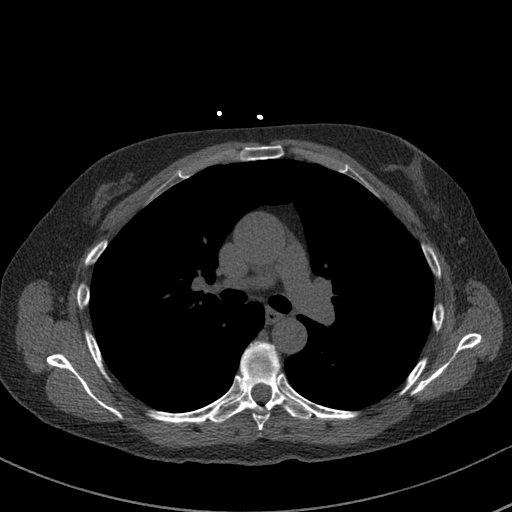
[im 63/76  lung]
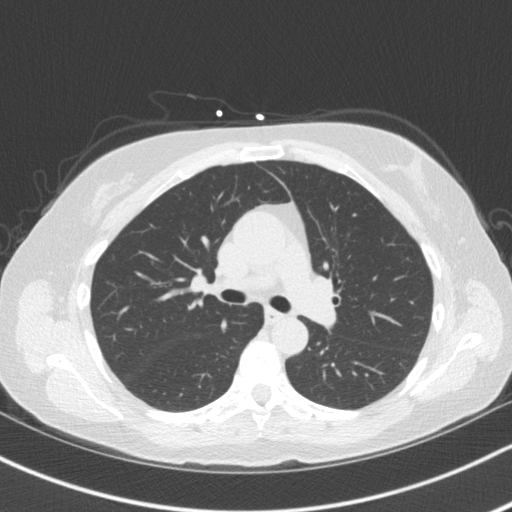

[Series 4: cascseq 2.0 br59 lung · axial · 0.65mm/px · z∈[-112,-12]mm · 5 of 76 slices shown]
[im 13/76  lung]
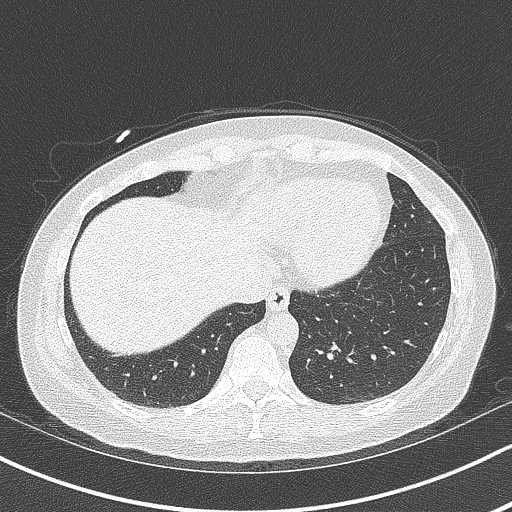
[im 26/76  lung]
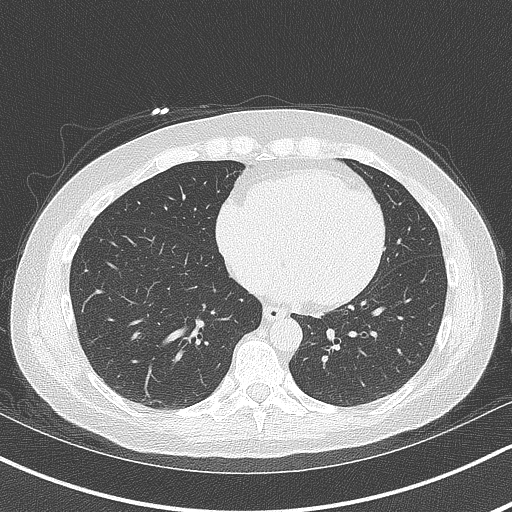
[im 38/76  lung]
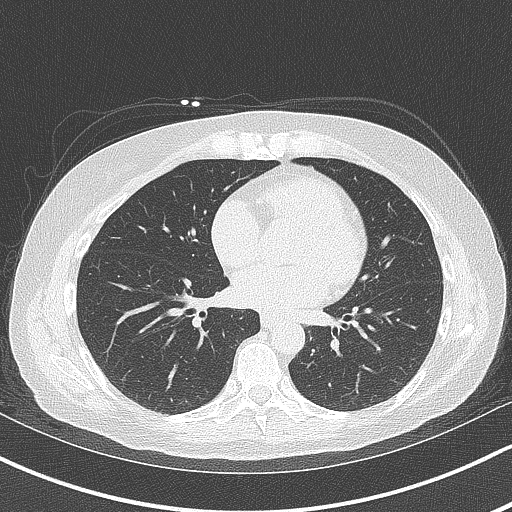
[im 51/76  lung]
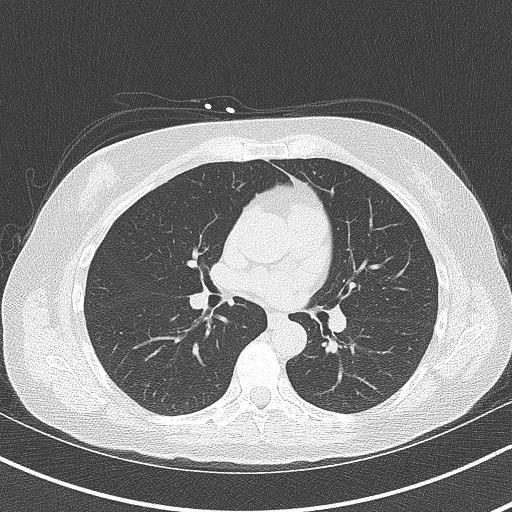
[im 63/76  lung]
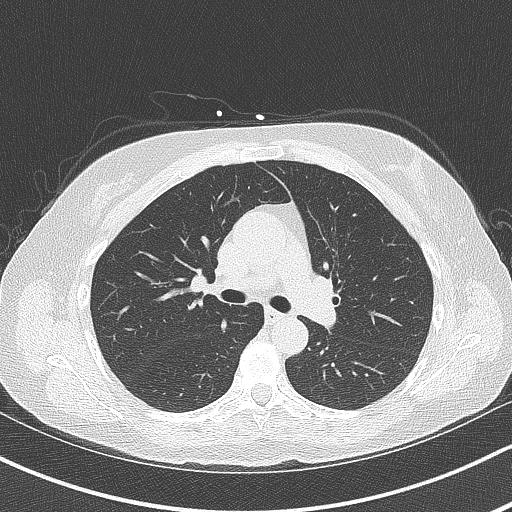

[14 of 20 positions shown; findings below may reference images not displayed]

FINDINGS: Vascular: No significant noncardiac vascular findings.

Mediastinum/Nodes: Visualized mediastinum and hilar regions
demonstrate no lymphadenopathy or masses.

Lungs/Pleura: There is a 5 mm rounded subpleural nodule in the
lateral left lower lobe on image 66. Area of fissural nodularity
measuring up to 6 mm is present in the right minor fissure on image
24. This most likely represents an intrapulmonary lymph node.
Visualized lungs show no evidence of pulmonary edema, consolidation,
pneumothorax or pleural fluid.

Upper Abdomen: No acute abnormality.

Musculoskeletal: No chest wall mass or suspicious bone lesions
identified.
IMPRESSION: 1. 5 mm subpleural nodule in the left lower lobe. No follow-up
needed if patient is low-risk.This recommendation follows the
consensus statement: Guidelines for Management of Incidental
Pulmonary Nodules Detected on CT Images: From the [HOSPITAL]
2. Additional 6 mm area of fissural nodularity/thickening in the
minor fissure on the right is felt to most likely represent an
intrapulmonary lymph node.
FINDINGS: Coronary arteries: Normal origins.

Coronary Calcium Score: 0

Percentile: 0

Pericardium: Normal.

Ascending Aorta: Normal caliber.  Mild aortic atherosclerosis.

Non-cardiac: See separate report from [REDACTED].
IMPRESSION: Coronary calcium score of 0. This was 0 percentile for age-, race-,
and sex-matched controls.

Mild aortic atherosclerosis.



If CAC=0, it is reasonable to withhold statin therapy and reassess
in 5 to 10 years, as long as higher risk conditions are absent
(diabetes mellitus, family history of premature CHD in first degree
relatives (males <55 years; females <65 years), cigarette smoking,
or LDL >=190 mg/dL).

If CAC is 1 to 99, it is reasonable to initiate statin therapy for
patients >=55 years of age.

If CAC is >=100 or >=75th percentile, it is reasonable to initiate
statin therapy at any age.

Cardiology referral should be considered for patients with CAC
scores >=400 or >=75th percentile.

*8691 AHA/ACC/AACVPR/AAPA/ABC/DEDEX TAOK/BARDECKAJA/BETH/Faustino/HAI/TIGER/KEI
Guideline on the Management of Blood Cholesterol: A Report of the
American College of Cardiology/American Heart Association Task Force
on Clinical Practice Guidelines. J Am Coll Cardiol.
4046;73(24):4911-4476.

*** End of Addendum ***
EXAM:
OVER-READ INTERPRETATION  CT CHEST

The following report is an over-read performed by radiologist Dr.
Mahesh Babu Ammulu [REDACTED] on 01/09/2022. This
over-read does not include interpretation of cardiac or coronary
anatomy or pathology. The coronary calcium score interpretation by
the cardiologist is attached.
FINDINGS: Vascular: No significant noncardiac vascular findings.

Mediastinum/Nodes: Visualized mediastinum and hilar regions
demonstrate no lymphadenopathy or masses.

Lungs/Pleura: There is a 5 mm rounded subpleural nodule in the
lateral left lower lobe on image 66. Area of fissural nodularity
measuring up to 6 mm is present in the right minor fissure on image
24. This most likely represents an intrapulmonary lymph node.
Visualized lungs show no evidence of pulmonary edema, consolidation,
pneumothorax or pleural fluid.

Upper Abdomen: No acute abnormality.

Musculoskeletal: No chest wall mass or suspicious bone lesions
identified.
IMPRESSION: 1. 5 mm subpleural nodule in the left lower lobe. No follow-up
needed if patient is low-risk.This recommendation follows the
consensus statement: Guidelines for Management of Incidental
Pulmonary Nodules Detected on CT Images: From the [HOSPITAL]
2. Additional 6 mm area of fissural nodularity/thickening in the
minor fissure on the right is felt to most likely represent an
intrapulmonary lymph node.

## 2022-12-09 ENCOUNTER — Other Ambulatory Visit: Payer: Self-pay | Admitting: Cardiology

## 2022-12-09 DIAGNOSIS — E7800 Pure hypercholesterolemia, unspecified: Secondary | ICD-10-CM

## 2022-12-09 DIAGNOSIS — Z8249 Family history of ischemic heart disease and other diseases of the circulatory system: Secondary | ICD-10-CM

## 2023-03-13 ENCOUNTER — Other Ambulatory Visit: Payer: Self-pay | Admitting: Cardiology

## 2023-03-13 DIAGNOSIS — Z8249 Family history of ischemic heart disease and other diseases of the circulatory system: Secondary | ICD-10-CM

## 2023-03-13 DIAGNOSIS — E7800 Pure hypercholesterolemia, unspecified: Secondary | ICD-10-CM

## 2024-05-17 ENCOUNTER — Other Ambulatory Visit: Payer: Self-pay | Admitting: Obstetrics and Gynecology

## 2024-05-17 DIAGNOSIS — Z1231 Encounter for screening mammogram for malignant neoplasm of breast: Secondary | ICD-10-CM
